# Patient Record
Sex: Female | Born: 1995 | Race: White | Hispanic: No | Marital: Single | Smoking: Never smoker
Health system: Southern US, Community
[De-identification: ages and names within clinical notes are randomized; demographics above are authoritative.]

---

## 2018-09-01 ENCOUNTER — Other Ambulatory Visit: Payer: Self-pay

## 2018-09-01 ENCOUNTER — Emergency Department (HOSPITAL_COMMUNITY): Payer: Medicaid Other

## 2018-09-01 ENCOUNTER — Emergency Department (HOSPITAL_COMMUNITY)
Admission: EM | Admit: 2018-09-01 | Discharge: 2018-09-01 | Disposition: A | Discharge status: Home / Self Care | Payer: Medicaid Other | Attending: Emergency Medicine | Admitting: Emergency Medicine

## 2018-09-01 ENCOUNTER — Encounter (HOSPITAL_COMMUNITY): Payer: Self-pay | Admitting: Emergency Medicine

## 2018-09-01 DIAGNOSIS — R112 Nausea with vomiting, unspecified: Secondary | ICD-10-CM | POA: Diagnosis not present

## 2018-09-01 DIAGNOSIS — Z79899 Other long term (current) drug therapy: Secondary | ICD-10-CM | POA: Insufficient documentation

## 2018-09-01 DIAGNOSIS — R197 Diarrhea, unspecified: Secondary | ICD-10-CM | POA: Insufficient documentation

## 2018-09-01 DIAGNOSIS — R1110 Vomiting, unspecified: Secondary | ICD-10-CM | POA: Diagnosis present

## 2018-09-01 DIAGNOSIS — J189 Pneumonia, unspecified organism: Secondary | ICD-10-CM | POA: Diagnosis not present

## 2018-09-01 DIAGNOSIS — J020 Streptococcal pharyngitis: Secondary | ICD-10-CM | POA: Diagnosis not present

## 2018-09-01 LAB — URINALYSIS, ROUTINE W REFLEX MICROSCOPIC
Bilirubin Urine: NEGATIVE
Glucose, UA: NEGATIVE mg/dL
Ketones, ur: 20 mg/dL — AB
NITRITE: NEGATIVE
Protein, ur: 30 mg/dL — AB
Specific Gravity, Urine: 1.027 (ref 1.005–1.030)
pH: 7 (ref 5.0–8.0)

## 2018-09-01 LAB — COMPREHENSIVE METABOLIC PANEL
ALT: 14 U/L (ref 0–44)
AST: 14 U/L — AB (ref 15–41)
Albumin: 3.5 g/dL (ref 3.5–5.0)
Alkaline Phosphatase: 71 U/L (ref 38–126)
Anion gap: 9 (ref 5–15)
BUN: 7 mg/dL (ref 6–20)
CO2: 19 mmol/L — ABNORMAL LOW (ref 22–32)
Calcium: 8.7 mg/dL — ABNORMAL LOW (ref 8.9–10.3)
Chloride: 109 mmol/L (ref 98–111)
Creatinine, Ser: 0.57 mg/dL (ref 0.44–1.00)
GFR calc Af Amer: >60 mL/min (ref >60)
Glucose, Bld: 96 mg/dL (ref 70–99)
Potassium: 3.2 mmol/L — ABNORMAL LOW (ref 3.5–5.1)
Sodium: 137 mmol/L (ref 135–145)
TOTAL PROTEIN: 6.9 g/dL (ref 6.5–8.1)
Total Bilirubin: 1 mg/dL (ref 0.3–1.2)

## 2018-09-01 LAB — I-STAT BETA HCG BLOOD, ED (MC, WL, AP ONLY): I-stat hCG, quantitative: <5 m[IU]/mL (ref <5)

## 2018-09-01 LAB — GROUP A STREP BY PCR: Group A Strep by PCR: DETECTED — AB

## 2018-09-01 LAB — CBC
HCT: 43.1 % (ref 36.0–46.0)
Hemoglobin: 14 g/dL (ref 12.0–15.0)
MCH: 28.4 pg (ref 26.0–34.0)
MCHC: 32.5 g/dL (ref 30.0–36.0)
MCV: 87.4 fL (ref 80.0–100.0)
Platelets: 182 10*3/uL (ref 150–400)
RBC: 4.93 MIL/uL (ref 3.87–5.11)
RDW: 13.8 % (ref 11.5–15.5)
WBC: 16.5 10*3/uL — ABNORMAL HIGH (ref 4.0–10.5)
nRBC: 0 % (ref 0.0–0.2)

## 2018-09-01 LAB — INFLUENZA PANEL BY PCR (TYPE A & B)
Influenza A By PCR: NEGATIVE
Influenza B By PCR: NEGATIVE

## 2018-09-01 LAB — LIPASE, BLOOD: Lipase: 30 U/L (ref 11–51)

## 2018-09-01 MED ORDER — ONDANSETRON HCL 4 MG/2ML IJ SOLN
4.0000 mg | Freq: Once | INTRAMUSCULAR | Status: AC | PRN
  Administered 2018-09-01: 4 mg via INTRAVENOUS
  Filled 2018-09-01: qty 2

## 2018-09-01 MED ORDER — DOXYCYCLINE HYCLATE 100 MG PO TABS
100.0000 mg | ORAL_TABLET | Freq: Once | ORAL | Status: AC
  Administered 2018-09-01: 100 mg via ORAL
  Filled 2018-09-01: qty 1

## 2018-09-01 MED ORDER — SODIUM CHLORIDE 0.9 % IV BOLUS
1000.0000 mL | Freq: Once | INTRAVENOUS | Status: AC
  Administered 2018-09-01: 1000 mL via INTRAVENOUS

## 2018-09-01 MED ORDER — PENICILLIN G BENZATHINE 1200000 UNIT/2ML IM SUSP
1.2000 10*6.[IU] | Freq: Once | INTRAMUSCULAR | Status: AC
  Administered 2018-09-01: 1.2 10*6.[IU] via INTRAMUSCULAR
  Filled 2018-09-01: qty 2

## 2018-09-01 MED ORDER — ONDANSETRON 4 MG PO TBDP: 4.0000 mg | ORAL_TABLET | Freq: Three times a day (TID) | ORAL | 0 refills | Status: AC | PRN

## 2018-09-01 MED ORDER — DOXYCYCLINE HYCLATE 100 MG PO TABS: 100.0000 mg | ORAL_TABLET | Freq: Two times a day (BID) | ORAL | 0 refills | Status: AC

## 2018-09-01 MED ORDER — ACETAMINOPHEN 325 MG PO TABS
650.0000 mg | ORAL_TABLET | Freq: Once | ORAL | Status: AC
  Administered 2018-09-01: 650 mg via ORAL
  Filled 2018-09-01: qty 2

## 2018-09-01 MED ORDER — ONDANSETRON HCL 4 MG/2ML IJ SOLN: 4.0000 mg | INTRAMUSCULAR | Status: DC | PRN

## 2018-09-01 MED ORDER — POTASSIUM CHLORIDE CRYS ER 20 MEQ PO TBCR
40.0000 meq | EXTENDED_RELEASE_TABLET | Freq: Once | ORAL | Status: AC
  Administered 2018-09-01: 40 meq via ORAL
  Filled 2018-09-01: qty 2

## 2018-09-01 MED ORDER — IBUPROFEN 200 MG PO TABS
400.0000 mg | ORAL_TABLET | Freq: Once | ORAL | Status: AC
  Administered 2018-09-01: 400 mg via ORAL
  Filled 2018-09-01: qty 2

## 2018-09-01 NOTE — ED Triage Notes (Signed)
Pt c/o sore throat since yesterday and v/v/d since around 2 am. Last tylenol around 10am today/.

## 2018-09-01 NOTE — ED Provider Notes (Signed)
[facility]-EMERGENCY DEPT Provider Note   CSN: [id number] Arrival date & time: 09/01/18  1656    History   Chief Complaint Chief Complaint  Patient presents with  . Emesis  . Diarrhea  . Sore Throat    HPI Rachel Powers is a 23 y.o. female.     HPI  Pt was seen at 1730. Per pt, c/o gradual onset and persistence of constant multiple symptoms for the past 2 days. Symptoms include: sore throat, multiple intermittent episodes of N/V/D, temps to "103," generalized body aches.  Describes the stools as "watery." LD APAP 10am today. Pt states she has "had the flu twice this year already."  Denies abd pain, no CP/SOB, no back pain, no black or blood in stools or emesis.    History reviewed. No pertinent past medical history.  There are no active problems to display for this patient.   History reviewed. No pertinent surgical history.   OB History   No obstetric history on file.      Home Medications    Prior to Admission medications   Medication Sig Start Date End Date Taking? Authorizing Provider  acetaminophen (TYLENOL) 325 MG tablet Take 325 mg by mouth every 6 (six) hours as needed for fever or headache.   Yes [provider]  etonogestrel (NEXPLANON) 68 MG IMPL implant 1 each by Subdermal route once.   Yes [provider]  ibuprofen (ADVIL,MOTRIN) 200 MG tablet Take 400 mg by mouth every 6 (six) hours as needed for fever, headache or moderate pain.   Yes [provider]  Phenyleph-CPM-DM-APAP (ALKA-SELTZER PLUS COLD & FLU PO) Take 2 tablets by mouth every 4 (four) hours as needed (fever, cough, headache, sore throat).   Yes [provider]    Family History No family history on file.  Social History Social History   Tobacco Use  . Smoking status: Never Smoker  . Smokeless tobacco: Never Used  Substance Use Topics  . Alcohol use: Not Currently  . Drug use: Not on file     Allergies   Sulfa  antibiotics   Review of Systems Review of Systems ROS: Statement: All systems negative except as marked or noted in the HPI; Constitutional: +fever and chills, generalized body aches.. ; ; Eyes: Negative for eye pain, redness and discharge. ; ; ENMT: Negative for ear pain, hoarseness, nasal congestion, sinus pressure and +sore throat. ; ; Cardiovascular: Negative for chest pain, palpitations, diaphoresis, dyspnea and peripheral edema. ; ; Respiratory: Negative for cough, wheezing and stridor. ; ; Gastrointestinal: +N/V/D. Negative for abdominal pain, blood in stool, hematemesis, jaundice and rectal bleeding. . ; ; Genitourinary: Negative for dysuria, flank pain and hematuria. ; ; Musculoskeletal: Negative for back pain and neck pain. Negative for swelling and trauma.; ; Skin: Negative for pruritus, rash, abrasions, blisters, bruising and skin lesion.; ; Neuro: Negative for headache, lightheadedness and neck stiffness. Negative for weakness, altered level of consciousness, altered mental status, extremity weakness, paresthesias, involuntary movement, seizure and syncope.       Physical Exam Updated Vital Signs Pulse (!) 158   Temp (!) 100.6 F (38.1 C) (Oral)   Resp 19   SpO2 98%    Patient Vitals for the past 24 hrs:  BP Temp Temp src Pulse Resp SpO2 Height Weight  09/01/18 1940 - 99.5 F (37.5 C) Oral (!) 101 - - 5' (1.524 m) 97.5 kg  09/01/18 1911 133/85 - - (!) 117 18 95 % - -  09/01/18 1703 - (!) 100.6 F (38.1 C) Oral (!) 158 19 98 % - -     Physical Exam 1735: Physical examination:  Nursing notes reviewed; Vital signs and O2 SAT reviewed;  Constitutional: Well developed, Well nourished, In no acute distress; Head:  Normocephalic, atraumatic; Eyes: EOMI, PERRL, No scleral icterus; ENMT: TM's clear bilat. +edemetous nasal turbinates bilat with clear rhinorrhea.  Mouth and pharynx without lesions. +mild erythema posterior pharynx with tonsillar exudates. No intra-oral edema. No  submandibular or sublingual edema. No hoarse voice, no drooling, no stridor. No pain with manipulation of larynx. No trismus. Mouth and pharynx normal, Mucous membranes dry; Neck: Supple, Full range of motion, No lymphadenopathy; Cardiovascular: Tachycardic rate and rhythm, No gallop; Respiratory: Breath sounds clear & equal bilaterally, No wheezes.  Speaking full sentences with ease, Normal respiratory effort/excursion; Chest: Nontender, Movement normal; Abdomen: Soft, Nontender, Nondistended, Normal bowel sounds; Genitourinary: No CVA tenderness; Extremities: Peripheral pulses normal, No tenderness, No edema, No calf edema or asymmetry.; Neuro: AA&Ox3, Major CN grossly intact.  Speech clear. No gross focal motor or sensory deficits in extremities.; Skin: Color normal, Warm, Dry.   ED Treatments / Results  Labs (all labs ordered are listed, but only abnormal results are displayed)   EKG None  Radiology   Procedures Procedures (including critical care time)  Medications Ordered in ED Medications  ondansetron (ZOFRAN) injection 4 mg (has no administration in time range)  sodium chloride 0.9 % bolus 1,000 mL (has no administration in time range)  ondansetron (ZOFRAN) injection 4 mg (has no administration in time range)  ibuprofen (ADVIL,MOTRIN) tablet 400 mg (400 mg Oral Given 09/01/18 1754)  acetaminophen (TYLENOL) tablet 650 mg (650 mg Oral Given 09/01/18 1754)     Initial Impression / Assessment and Plan / ED Course  I have reviewed the triage vital signs and the nursing notes.  Pertinent labs & imaging results that were available during my care of the patient were reviewed by me and considered in my medical decision making (see chart for details).     MDM Reviewed: previous chart, nursing note and vitals Reviewed previous: labs Interpretation: labs and x-ray    Results for orders placed or performed during the hospital encounter of 09/01/18  Group A Strep by PCR  Result  Value Ref Range   Group A Strep by PCR DETECTED (A) NOT DETECTED  Lipase, blood  Result Value Ref Range   Lipase 30 11 - 51 U/L  Comprehensive metabolic panel  Result Value Ref Range   Sodium 137 135 - 145 mmol/L   Potassium 3.2 (L) 3.5 - 5.1 mmol/L   Chloride 109 98 - 111 mmol/L   CO2 19 (L) 22 - 32 mmol/L   Glucose, Bld 96 70 - 99 mg/dL   BUN 7 6 - 20 mg/dL   Creatinine, Ser 6.97 0.44 - 1.00 mg/dL   Calcium 8.7 (L) 8.9 - 10.3 mg/dL   Total Protein 6.9 6.5 - 8.1 g/dL   Albumin 3.5 3.5 - 5.0 g/dL   AST 14 (L) 15 - 41 U/L   ALT 14 0 - 44 U/L   Alkaline Phosphatase 71 38 - 126 U/L   Total Bilirubin 1.0 0.3 - 1.2 mg/dL   GFR calc non Af Amer >60 >60 mL/min   GFR calc Af Amer >60 >60 mL/min   Anion gap 9 5 - 15  CBC  Result Value Ref Range   WBC 16.5 (H) 4.0 - 10.5 K/uL   RBC 4.93  3.87 - 5.11 MIL/uL   Hemoglobin 14.0 12.0 - 15.0 g/dL   HCT 16.143.1 09.636.0 - 04.546.0 %   MCV 87.4 80.0 - 100.0 fL   MCH 28.4 26.0 - 34.0 pg   MCHC 32.5 30.0 - 36.0 g/dL   RDW 40.913.8 81.111.5 - 91.415.5 %   Platelets 182 150 - 400 K/uL   nRBC 0.0 0.0 - 0.2 %  Urinalysis, Routine w reflex microscopic  Result Value Ref Range   Color, Urine YELLOW YELLOW   APPearance HAZY (A) CLEAR   Specific Gravity, Urine 1.027 1.005 - 1.030   pH 7.0 5.0 - 8.0   Glucose, UA NEGATIVE NEGATIVE mg/dL   Hgb urine dipstick LARGE (A) NEGATIVE   Bilirubin Urine NEGATIVE NEGATIVE   Ketones, ur 20 (A) NEGATIVE mg/dL   Protein, ur 30 (A) NEGATIVE mg/dL   Nitrite NEGATIVE NEGATIVE   Leukocytes,Ua SMALL (A) NEGATIVE   RBC / HPF 0-5 0 - 5 RBC/hpf   WBC, UA 11-20 0 - 5 WBC/hpf   Bacteria, UA RARE (A) NONE SEEN   Squamous Epithelial / LPF 21-50 0 - 5   Mucus PRESENT   Influenza panel by PCR (type A & B)  Result Value Ref Range   Influenza A By PCR NEGATIVE NEGATIVE   Influenza B By PCR NEGATIVE NEGATIVE  I-Stat beta hCG blood, ED  Result Value Ref Range   I-stat hCG, quantitative <5.0 <5 mIU/mL   Comment 3           Dg Abd Acute  W/chest Result Date: 09/01/2018 CLINICAL DATA:  23 year old female with sore throat, nausea vomiting diarrhea. EXAM: DG ABDOMEN ACUTE W/ 1V CHEST COMPARISON:  None. FINDINGS: Confluent peribronchial opacity in the left mid lung. No pleural effusion. Lung markings elsewhere appear normal. Normal cardiac size and mediastinal contours. Visualized tracheal air column is within normal limits. No pneumothorax, pneumoperitoneum. Non obstructed bowel gas pattern. Abdominal and pelvic visceral contours appear normal. Mild levoconvex spinal curvature. No other osseous abnormality identified. IMPRESSION: 1. Left lung Bronchopneumonia.  No pleural effusion. 2. Normal bowel gas pattern, no free air. Electronically Signed   By: Odessa FlemingH  Hall M.D.   On: 09/01/2018 20:14    2140:  Udip appears contaminated. IM PCN given for strep throat. Doxycycline for CAP. Potassium repleted PO. Pt has tol PO well while in the ED without N/V.  No stooling while in the ED.  Abd remains benign, resps easy, VSS. Feels better and wants to go home now. Dx and testing d/w pt.  Questions answered.  Verb understanding, agreeable to d/c home with outpt f/u.     Final Clinical Impressions(s) / ED Diagnoses   Final diagnoses:  None    ED Discharge Orders    None       Samuel JesterMcManus, Hensley Aziz, DO 09/04/18 [id number]

## 2018-09-01 NOTE — Discharge Instructions (Signed)
Take the prescriptions as directed.  Increase your fluid intake (ie:  Gatoraide) for the next few days, as discussed.  Eat a bland diet and advance to your regular diet slowly as you can tolerate it.   Avoid full strength juices, as well as milk and milk products until your diarrhea has resolved.   Call your regular medical doctor tomorrow to schedule a follow up appointment this week.  Return to the Emergency Department immediately sooner if worsening.

## 2019-11-13 IMAGING — CR DG ABDOMEN ACUTE W/ 1V CHEST
3 series · 3 of 3 positions shown · non-contrast
Comparison: None.

CLINICAL DATA: 22-year-old female with sore throat, nausea vomiting
diarrhea.

EXAM:
DG ABDOMEN ACUTE W/ 1V CHEST

[w chest pa]
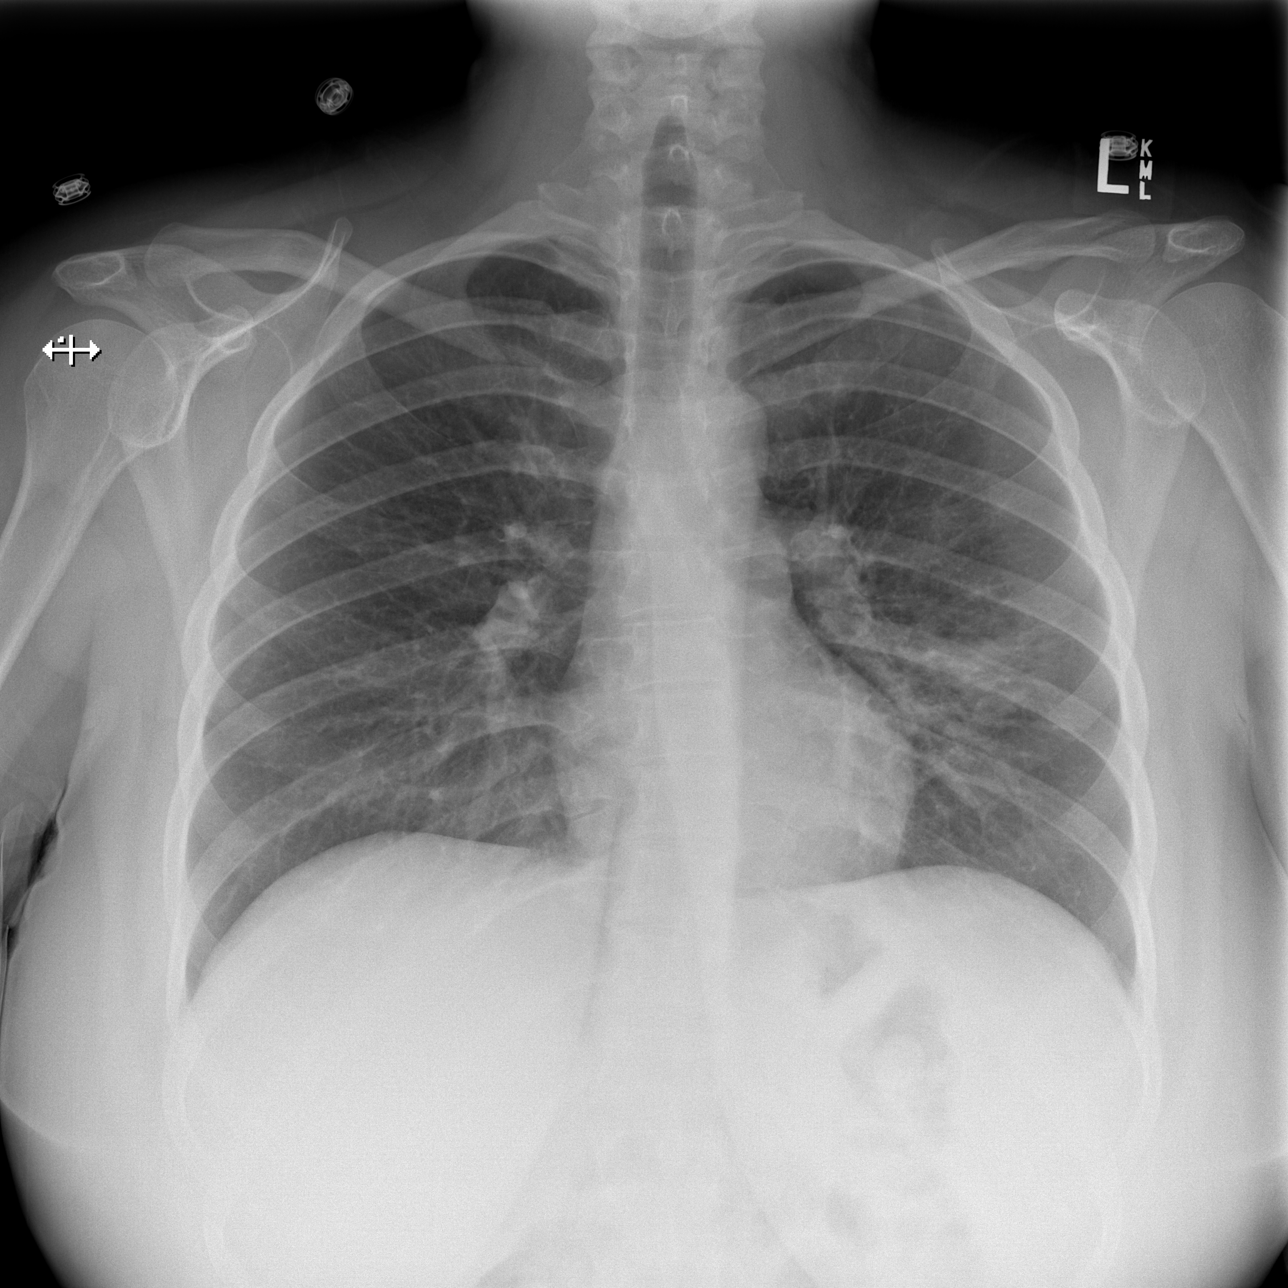

[w abdomen upright]
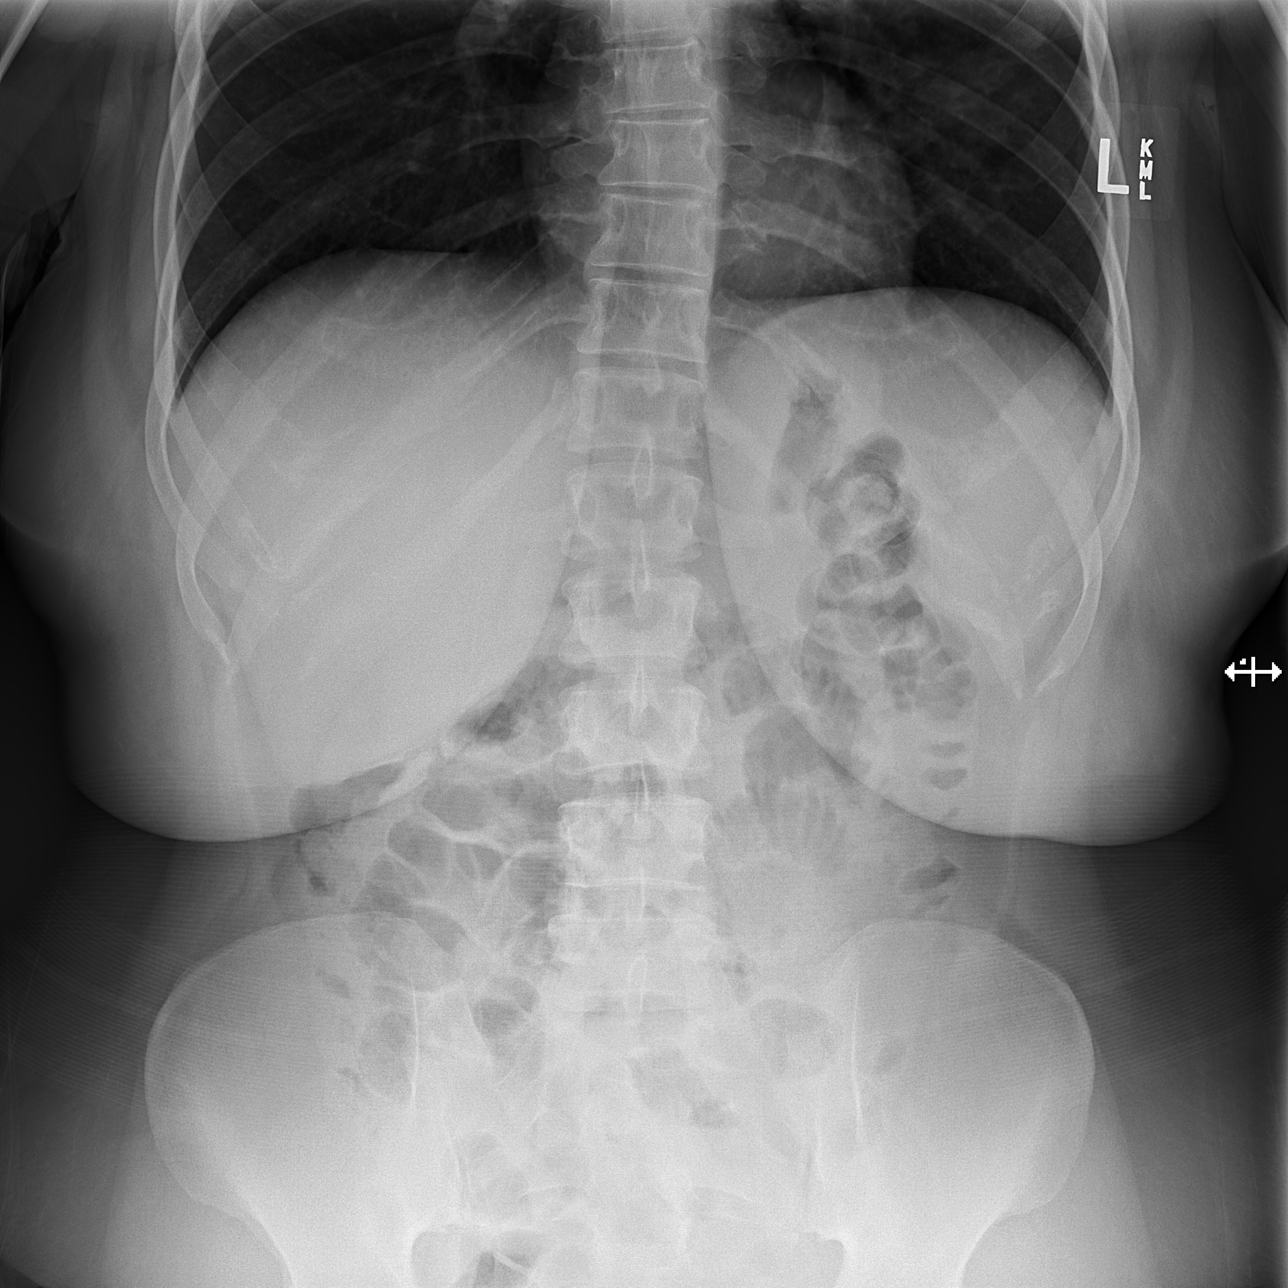

[t abdomen supine]
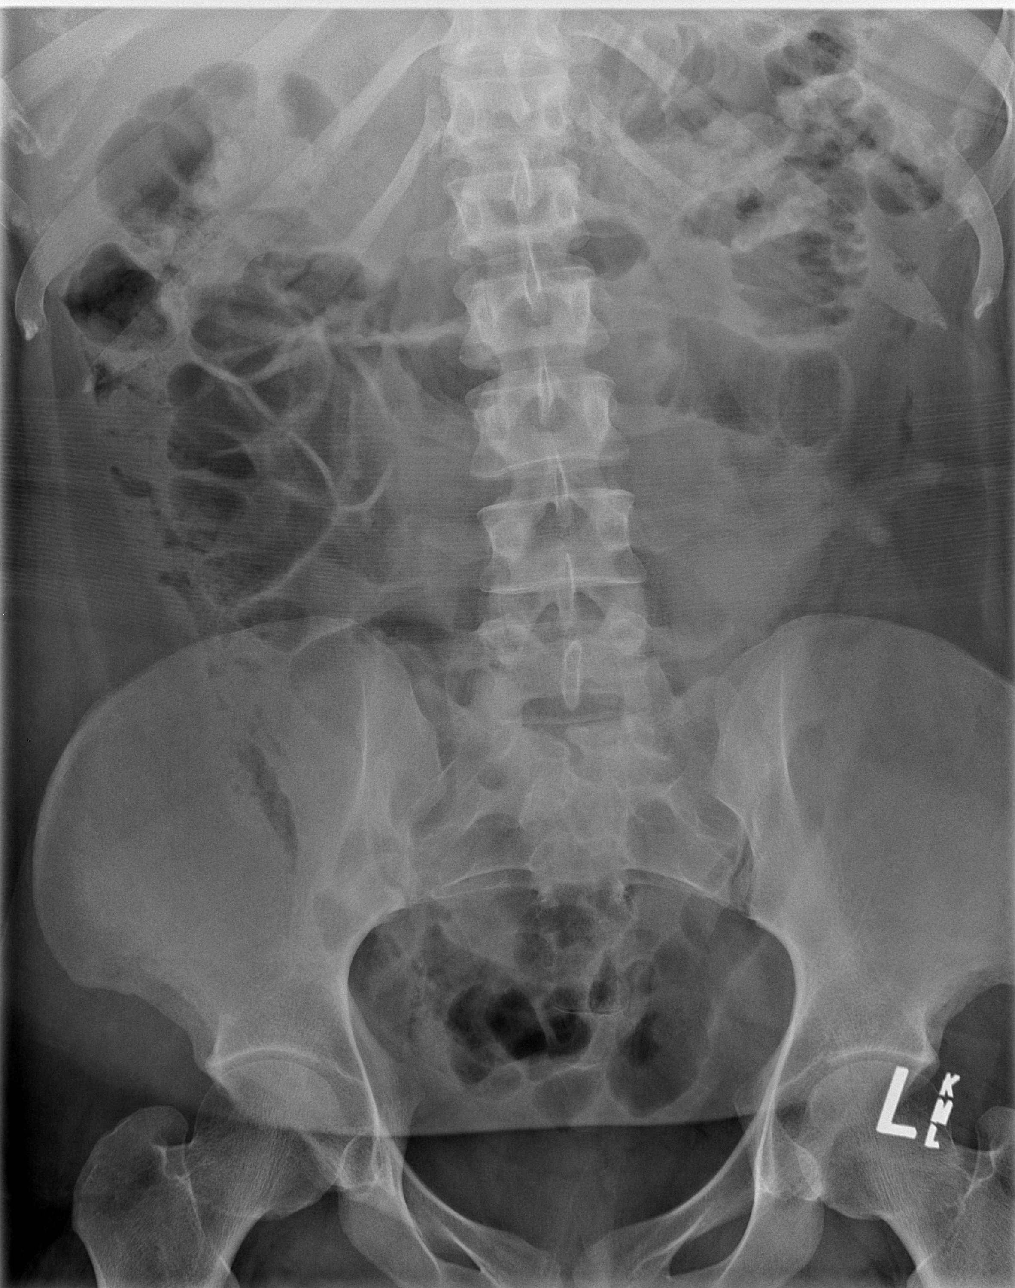

[3 of 3 positions shown; findings below may reference images not displayed]

FINDINGS: Confluent peribronchial opacity in the left mid lung. No pleural
effusion. Lung markings elsewhere appear normal.

Normal cardiac size and mediastinal contours. Visualized tracheal
air column is within normal limits. No pneumothorax,
pneumoperitoneum.

Non obstructed bowel gas pattern. Abdominal and pelvic visceral
contours appear normal. Mild levoconvex spinal curvature. No other
osseous abnormality identified.
IMPRESSION: 1. Left lung Bronchopneumonia.  No pleural effusion.
2. Normal bowel gas pattern, no free air.
